# Patient Record
Sex: Female | Born: 1950 | Race: White | Hispanic: No | Marital: Single | Smoking: Former smoker
Health system: Southern US, Community
[De-identification: ages and names within clinical notes are randomized; demographics above are authoritative.]

## PROBLEM LIST (undated history)

## (undated) DIAGNOSIS — E079 Disorder of thyroid, unspecified: Secondary | ICD-10-CM

## (undated) DIAGNOSIS — M069 Rheumatoid arthritis, unspecified: Secondary | ICD-10-CM

## (undated) DIAGNOSIS — G35 Multiple sclerosis: Secondary | ICD-10-CM

## (undated) HISTORY — DX: Rheumatoid arthritis, unspecified: M06.9

## (undated) HISTORY — PX: ABDOMINAL HYSTERECTOMY: SHX81

## (undated) HISTORY — DX: Disorder of thyroid, unspecified: E07.9

## (undated) HISTORY — PX: EYE SURGERY: SHX253

## (undated) HISTORY — DX: Multiple sclerosis: G35

---

## 2000-12-13 ENCOUNTER — Emergency Department (HOSPITAL_COMMUNITY): Admission: EM | Admit: 2000-12-13 | Discharge: 2000-12-13 | Payer: Self-pay

## 2001-12-02 ENCOUNTER — Emergency Department (HOSPITAL_COMMUNITY): Admission: EM | Admit: 2001-12-02 | Discharge: 2001-12-02 | Payer: Self-pay | Admitting: Emergency Medicine

## 2001-12-02 ENCOUNTER — Encounter: Payer: Self-pay | Admitting: Emergency Medicine

## 2003-04-22 ENCOUNTER — Other Ambulatory Visit: Admission: RE | Admit: 2003-04-22 | Discharge: 2003-04-22 | Payer: Self-pay | Admitting: Internal Medicine

## 2015-04-09 ENCOUNTER — Ambulatory Visit: Payer: BLUE CROSS/BLUE SHIELD | Admitting: Neurology

## 2020-08-07 ENCOUNTER — Ambulatory Visit: Payer: BC Managed Care – PPO | Admitting: Neurology

## 2020-11-03 ENCOUNTER — Ambulatory Visit (INDEPENDENT_AMBULATORY_CARE_PROVIDER_SITE_OTHER): Payer: BC Managed Care – PPO | Admitting: Neurology

## 2020-11-03 ENCOUNTER — Encounter: Payer: Self-pay | Admitting: Neurology

## 2020-11-03 VITALS — BP 133/82 | HR 71 | Ht 64.0 in | Wt 118.0 lb

## 2020-11-03 DIAGNOSIS — M542 Cervicalgia: Secondary | ICD-10-CM

## 2020-11-03 DIAGNOSIS — M059 Rheumatoid arthritis with rheumatoid factor, unspecified: Secondary | ICD-10-CM | POA: Diagnosis not present

## 2020-11-03 DIAGNOSIS — G35 Multiple sclerosis: Secondary | ICD-10-CM

## 2020-11-03 DIAGNOSIS — R200 Anesthesia of skin: Secondary | ICD-10-CM | POA: Diagnosis not present

## 2020-11-03 MED ORDER — OXCARBAZEPINE 300 MG PO TABS: 300.0000 mg | ORAL_TABLET | Freq: Two times a day (BID) | ORAL | 5 refills | Status: AC

## 2020-11-03 NOTE — Progress Notes (Addendum)
[facility]  PATIENT: Vanessa Lynch DOB: 1950/11/23  REFERRING DOCTOR OR PCP: [name], MD; [name] (rheumatology); Rodrigo Ran MD (PCP) SOURCE: Patient, notes from neurology ([facility] and [facility]), laboratory reports, imaging reports  _________________________________   HISTORICAL  CHIEF COMPLAINT:  Chief Complaint  Patient presents with   New Patient (Initial Visit)    MS. Taking Rituxan for RA. Taking arava for MS. Has seeen 2 neurologists at [facility]. Referred from Dr. Larose Kells at [place].    HISTORY OF PRESENT ILLNESS:  I had the pleasure of seeing your patient, Vanessa Lynch, at the MS center [facility] for neurologic consultation regarding her diagnosis of primary progressive MS and treatment options.  She is a 70 year old woman who was diagnosed with MS in 2016 after presenting with spasticity, weakness and abnormal brain MRI.  In 1991, she had Lyme disease.  She had a Bullseye rash  She saw infectious disease and it was treated.   She was also found to have an autoimmune thyroid issue a that time.    She had no neurologic issues with the infection or afterwards.  She was diagnosed with RA in 2015 due to right thumb pain and additional testing and xrays.  .   She was initially treated with methotrexate.   She was having spasticity, left > right, and was referred to Neurology.  The spasticity had developed slowly starting in 2012 and is leg > arm, left > right.   .    She was referred to Dr. [name] ad then Dr. [name].   An MRI was worrisome for MS with white matter foci including periventricular foci.  MS was considered due to these symptoms and MRI.  An LP was reportedly inconclusive as it was a bloody tap.  However, according to [facility] neurology notes, oligoclonal bands were not seen.  She was referred to Dr. [name].    She felt she had PPMS and after discussion with Rheumatology, Rituxan was started in 2017.     Compared to  2068, her spasticity has slowly worsened.    Currently, she has noted some gait disturbance due to the left leg but she does not need a cane.   She has the spasticity and takes baclofen.   She initially tried tizanidie which worked better at first but then stopped..    She can walk > 1 mile but gets very tired if she walks more and is very tired for a few days afterwards.     She has numbness and tingling in her feet/legs ad left shin pain.    She has longstanding reduced vision on the right and notes some blurring at times on the left.    Bladder has some frequency but no incontinence.   .    She has dysesthetic pain in the back of the head bilateral and pressing in this region causes pain in the face.   She ha right facial pain that sometimes is like a jolt and can be triggered by pushing the occiput.   Topamax helped initially but now pain is back.   She could not tolerate gabapentin.   Dental exam was fine.   She also has neck pain and pain in the upper back.   She has migraines, helped by topiramate.   She is doing PT   She notes fatigue and mild cognitive dysfunction at times.  Word finding is sometimes difficult.  She is not sure if she is getting a  benefit from Rituxan and is concerned about the risk of increased cancer.    She feels the slope of the decline if no better on Rituxan.  It has not helped her RA pain much.    She is also on leflunomide   Imaging reports: MRI 08/08/2018:   Similar pattern (Compared to 08/17/2016), distribution, and burden of the periventricular and juxtacortical T2/FLAIR hyperintense signal, many of which are perpendicularly oriented to the lateral ventricles. No abnormal enhancement or diffusion signal. No evidence of acute infarct. No mass effect, acute hemorrhage, or hydrocephalus. No progressive cerebral volume loss. Grossly normal flow-related signal in the major intracranial arteries and dural sinuses.      MRI Cervical spine 1221/2016 showed a normal spial cord,   mild spinal stenosis at C5C6 and other DJD at [facility] with foraminal narrowing.  MRI thoracic spine showed a nornal spinal cord and no DJD   REVIEW OF SYSTEMS: Constitutional: No fevers, chills, sweats, or change in appetite Eyes: No visual changes, double vision, eye pain Ear, nose and throat: No hearing loss, ear pain, nasal congestion, sore throat Cardiovascular: No chest pain, palpitations Respiratory:  No shortness of breath at rest or with exertion.   No wheezes GastrointestinaI: No nausea, vomiting, diarrhea, abdominal pain, fecal incontinence Genitourinary:  No dysuria, urinary retention or frequency.  No nocturia. Musculoskeletal:  No neck pain, back pain Integumentary: No rash, pruritus, skin lesions Neurological: as above Psychiatric: No depression at this time.  No anxiety Endocrine: No palpitations, diaphoresis, change in appetite, change in weigh or increased thirst Hematologic/Lymphatic:  No anemia, purpura, petechiae. Allergic/Immunologic: No itchy/runny eyes, nasal congestion, recent allergic reactions, rashes  ALLERGIES: Allergies  Allergen Reactions   Gabapentin     HOME MEDICATIONS:  Current Outpatient Medications:    alendronate (FOSAMAX) 70 MG tablet, Take 70 mg by mouth once a week. Take with a full glass of water on an empty stomach., Disp: , Rfl:    baclofen (LIORESAL) 10 MG tablet, Take 15 mg by mouth 2 (two) times daily., Disp: , Rfl:    BIOTIN PO, Take by mouth., Disp: , Rfl:    leflunomide (ARAVA) 20 MG tablet, Take 20 mg by mouth daily., Disp: , Rfl:    levothyroxine (SYNTHROID) 88 MCG tablet, Take 88 mcg by mouth daily before breakfast., Disp: , Rfl:    methylphenidate (RITALIN) 20 MG tablet, Take 20 mg by mouth daily., Disp: , Rfl:    predniSONE (DELTASONE) 5 MG tablet, Take 5 mg by mouth daily with breakfast., Disp: , Rfl:    promethazine (PHENERGAN) 25 MG tablet, Take 25 mg by mouth every 6 (six) hours as needed for nausea or vomiting., Disp: , Rfl:     riTUXimab (RITUXAN IV), Inject into the vein., Disp: , Rfl:    rizatriptan (MAXALT) 10 MG tablet, Take 10 mg by mouth as needed for migraine. May repeat in 2 hours if needed, Disp: , Rfl:    topiramate (TOPAMAX) 50 MG tablet, Take 75 mg by mouth 2 (two) times daily., Disp: , Rfl:    valACYclovir (VALTREX) 1000 MG tablet, Take 1,000 mg by mouth daily as needed., Disp: , Rfl:   PAST MEDICAL HISTORY: Past Medical History:  Diagnosis Date   Multiple sclerosis (HCC)    RA (rheumatoid arthritis) (HCC)    Thyroid disease     PAST SURGICAL HISTORY:   FAMILY HISTORY: No family history on file.  SOCIAL HISTORY:  Social History   Socioeconomic History   Marital status: Single  Spouse name: Not on file   Number of children: Not on file   Years of education: Not on file   Highest education level: Not on file  Occupational History   Not on file  Tobacco Use   Smoking status: Former    Types: Cigarettes   Smokeless tobacco: Never  Substance and Sexual Activity   Alcohol use: Not on file   Drug use: Not on file   Sexual activity: Not on file  Other Topics Concern   Not on file  Social History Narrative   Not on file   Social Determinants of Health   Financial Resource Strain: Not on file  Food Insecurity: Not on file  Transportation Needs: Not on file  Physical Activity: Not on file  Stress: Not on file  Social Connections: Not on file  Intimate Partner Violence: Not on file     PHYSICAL EXAM  Vitals:   11/03/20 0908  BP: 133/82  Pulse: 71  Weight: 118 lb (53.5 kg)  Height: 5\' 4"  (1.626 m)    Body mass index is 20.25 kg/m.   General: The patient is well-developed and well-nourished and in no acute distress  HEENT:  Head is [place]/AT.  Sclera are anicteric.    Neck: No carotid bruits are noted.  The neck is nontender.  Cardiovascular: The heart has a regular rate and rhythm with a normal S1 and S2. There were no murmurs, gallops or rubs.    Skin:  Extremities are without rash or  edema.  Musculoskeletal:  Back is nontender  Neurologic Exam  Mental status: The patient is alert and oriented x 3 at the time of the examination. The patient has apparent normal recent and remote memory, with an apparently normal attention span and concentration ability.   Speech is normal.  Cranial nerves: Extraocular movements are full. Pupils are equal, round, and reactive to light and accomodation.  Visual fields are full.  Facial symmetry is present. There is reduced sensation on the right V2 distribution.  .Facial strength is normal.  Trapezius and sternocleidomastoid strength is normal. No dysarthria is noted.  The tongue is midline, and the patient has symmetric elevation of the soft palate. Reduced right hearing.  Weber lateralized left.   Motor:  Muscle bulk is normal.   Tone is normal. Strength is  5 / 5 in the arms except for 4+/5 right grip.  Strength was 4+/5 in right hip flexion.  5/5 elsewhere.  Sensory: Sensory testing is intact to pinprick, soft touch and vibration sensation in the legs but she reported mildly reduced right vibration sensation and right temperature sensation in toes and arms.   Coordination: Cerebellar testing reveals good finger-nose-finger and heel-to-shin bilaterally.  Gait and station: Station is normal.   Gait is mildly wide.  No definite foot drop.  Tandem gait is wide. Romberg is negative.   Reflexes: Deep tendon reflexes are symmetric and normal in the arms.  Reflexes were increased in the right knee relative to the left knee.  No ankle clonus..   Plantar responses are flexor.      ASSESSMENT AND PLAN  Multiple sclerosis (HCC) - Plan: MR CERVICAL SPINE WO CONTRAST  Neck pain - Plan: MR CERVICAL SPINE WO CONTRAST  Numbness - Plan: MR CERVICAL SPINE WO CONTRAST  Rheumatoid arthritis with positive rheumatoid factor, involving unspecified site [facility]) - Plan: MR CERVICAL SPINE WO CONTRAST    In summary, Ms.  Uher is a 70 year old woman with a diagnosis of primary  progressive MS.  Unfortunately, she did not have her actual MRI images with her and I was unable to get access through the computer as they were done at [facility].  Of note, according to the report, MRI of the cervical and thoracic spine showed normal spinal cord and no significant spinal stenosis.  It is certainly possible that she does not have a primary progressive MS as a normal spinal cord MRI would be unusual.  We have requested the MRI images.     A complicating factor is that she also has a diagnosis of rheumatoid arthritis and notes significant joint pain at times.  Treatment options for RA are limited due to the MS.  Even if we are certain that she has primary progressive MS, it is possible that due to her age and the nature of PPMS, any potential benefit would be slight.  I will personally review the MRI.  If MRI looks like MS -then consider repeat LP to be more certain of the diagnosis.   Also consider Cosentyx instead of Rituxan as safe in MS and possible effective in RA.    If MRI does not look like MS - then could go on any RA medicaion.     To help with the facial pain, oxcarbazepine will be tried at a low dose and titrated up depending on results and tolerability.  She will call in 3 to 4 weeks if she wants to try a higher dose.  She will return in 2 months or sooner if there are new or worsening neurologic symptoms.  Thank you for asking me to see Ms. Frate .   Please let me know if I can be of further assistance with her or other patients in the future.  Teodor Prater A. Epimenio Foot, MD, [facility] 11/03/2020, 9:39 AM Certified in Neurology, Clinical Neurophysiology, Sleep Medicine and Neuroimaging  [facility] [address] [place], [place] [address] [phone]

## 2020-11-06 ENCOUNTER — Telehealth: Payer: Self-pay | Admitting: Neurology

## 2020-11-06 NOTE — Telephone Encounter (Signed)
[employer] Berkley Harvey: [phone] (exp. 11/05/20 to 12/04/20)  Patient is scheduled at [facility] for 11/15/20.

## 2020-11-11 ENCOUNTER — Telehealth: Payer: Self-pay | Admitting: *Deleted

## 2020-11-11 NOTE — Telephone Encounter (Signed)
R/c cd from [facility] cd on nurse desk

## 2020-11-15 ENCOUNTER — Other Ambulatory Visit: Payer: Self-pay

## 2020-11-15 ENCOUNTER — Ambulatory Visit (INDEPENDENT_AMBULATORY_CARE_PROVIDER_SITE_OTHER): Payer: BC Managed Care – PPO

## 2020-11-15 DIAGNOSIS — G35 Multiple sclerosis: Secondary | ICD-10-CM | POA: Diagnosis not present

## 2020-11-15 DIAGNOSIS — M542 Cervicalgia: Secondary | ICD-10-CM | POA: Diagnosis not present

## 2020-11-15 DIAGNOSIS — R200 Anesthesia of skin: Secondary | ICD-10-CM | POA: Diagnosis not present

## 2020-11-15 DIAGNOSIS — M059 Rheumatoid arthritis with rheumatoid factor, unspecified: Secondary | ICD-10-CM | POA: Diagnosis not present

## 2020-11-18 ENCOUNTER — Telehealth: Payer: Self-pay | Admitting: Neurology

## 2020-11-18 DIAGNOSIS — G35 Multiple sclerosis: Secondary | ICD-10-CM

## 2020-11-18 DIAGNOSIS — R9082 White matter disease, unspecified: Secondary | ICD-10-CM

## 2020-11-18 MED ORDER — TOPIRAMATE 50 MG PO TABS: 75.0000 mg | ORAL_TABLET | Freq: Two times a day (BID) | ORAL | 11 refills | Status: DC

## 2020-11-18 NOTE — Telephone Encounter (Signed)
I spoke with Vanessa Lynch about her cervical spine MRI.  The spinal cord was normal.  Specifically, there is no evidence of demyelination.  She does have significant degenerative changes with joint effusions and facet hypertrophy at C2-C3.  Some fluid is also noted around the facet joints at C1-C2.  These degenerative changes could explain the pain in the top of the neck and the back of the head.    I also reviewed her MRI of the brain and cervical spine from 10/02/2014. Although she has a diagnosis of primary progressive MS, the cervical spine did not show any T2 hyperintense foci.  Additionally, the foci present in the brain, though more than expected for age, are nonspecific with most of them being in the deep and subcortical white matter and just a few being periventricular.  34, I am not certain of her diagnosis of primary progressive MS pill we need to get additional information (she has rheumatoid arthritis that has not responded to well to leflunomide plus Rituxan and she would have many more options if she does not have a primary progressive MS than if she does).  In 2016, she had a lumbar puncture but it was a bloody tap in the studies were reportedly not able to be interpreted.  Therefore, I will set her up for a fluoroscopically guided lumbar puncture.

## 2020-11-18 NOTE — Telephone Encounter (Signed)
Additionally,  I will increase the oxcarbazepine to 3 times a day for the trigeminal neuralgia-like pain

## 2020-11-27 ENCOUNTER — Ambulatory Visit
Admission: RE | Admit: 2020-11-27 | Discharge: 2020-11-27 | Disposition: A | Discharge status: Home / Self Care | Payer: BC Managed Care – PPO | Source: Ambulatory Visit | Attending: Neurology | Admitting: Neurology

## 2020-11-27 ENCOUNTER — Other Ambulatory Visit: Payer: Self-pay

## 2020-11-27 DIAGNOSIS — R9082 White matter disease, unspecified: Secondary | ICD-10-CM

## 2020-11-27 DIAGNOSIS — G35 Multiple sclerosis: Secondary | ICD-10-CM

## 2020-11-27 NOTE — Progress Notes (Signed)
1 vial of blood drawn from pts RAC to be sent with LP lab work. 1 successful attempt. Pt tolerated well. Gauze and tape applied after. Pt reports she does not have a driver to drive her home after her LP today. I notified Dr. Karin Golden and he stated he would still do the procedure today and pt would be okay to drive home today.

## 2020-11-27 NOTE — Discharge Instructions (Signed)

## 2020-12-01 ENCOUNTER — Telehealth: Payer: Self-pay | Admitting: Neurology

## 2020-12-01 NOTE — Telephone Encounter (Signed)
Received a call from [facility] indicating that patient has received results from LP that was completed. [facility] has faxed the report to [place] for review. Will send to Dr Epimenio Foot to review.

## 2020-12-03 ENCOUNTER — Telehealth: Payer: Self-pay | Admitting: Neurology

## 2020-12-03 LAB — CNS IGG SYNTHESIS RATE, CSF+BLOOD
Albumin Serum: 4 g/dL (ref 3.2–4.6)
Albumin, CSF: 31.6 mg/dL (ref 8.0–42.0)
CNS-IgG Synthesis Rate: 0.1 mg/24 h (ref <=3.3)
IgG (Immunoglobin G), Serum: 421 mg/dL — ABNORMAL LOW (ref 600–1540)
IgG Total CSF: 1.8 mg/dL (ref 0.8–7.7)
IgG-Index: 0.54 (ref <0.66)

## 2020-12-03 LAB — CSF CELL COUNT WITH DIFFERENTIAL
RBC Count, CSF: 1 cells/uL — ABNORMAL HIGH
WBC, CSF: 2 cells/uL (ref 0–5)

## 2020-12-03 LAB — PROTEIN, CSF: Total Protein, CSF: 51 mg/dL (ref 15–60)

## 2020-12-03 LAB — GLUCOSE, CSF: Glucose, CSF: 50 mg/dL (ref 40–80)

## 2020-12-03 LAB — OLIGOCLONAL BANDS, CSF + SERM

## 2020-12-03 LAB — VDRL, CSF: VDRL Quant, CSF: NONREACTIVE

## 2020-12-03 NOTE — Telephone Encounter (Signed)
I spoke to the patient about the lumbar puncture results.  The oligoclonal bands were negative.  IgG index was normal.  I do not believe that she has MS.    On my review, the MRI changes in the brain are nonspecific.  Furthermore she has no spinal cord plaques.  The MRI of the cervical spine did not show a fair amount of degenerative change in the upper cervical spine and certainly that is causing some of her symptoms.    She she has an appointment to see me next week.  We will reevaluate her history and findings to see if another diagnosis could better explain her symptoms.  She has been on Rituxan for presumed primary progressive MS.  She did get an infusion earlier this week.  I discussed that she could probably go ahead and stop those infusions.  It is possible that the Rituxan could help her rheumatoid arthritis but other medications may help her more.

## 2020-12-08 ENCOUNTER — Ambulatory Visit (INDEPENDENT_AMBULATORY_CARE_PROVIDER_SITE_OTHER): Payer: BC Managed Care – PPO | Admitting: Neurology

## 2020-12-08 ENCOUNTER — Encounter: Payer: Self-pay | Admitting: Neurology

## 2020-12-08 VITALS — BP 146/69 | HR 68 | Ht 63.5 in | Wt 123.0 lb

## 2020-12-08 DIAGNOSIS — R9082 White matter disease, unspecified: Secondary | ICD-10-CM | POA: Diagnosis not present

## 2020-12-08 DIAGNOSIS — R200 Anesthesia of skin: Secondary | ICD-10-CM | POA: Diagnosis not present

## 2020-12-08 DIAGNOSIS — M542 Cervicalgia: Secondary | ICD-10-CM | POA: Diagnosis not present

## 2020-12-08 DIAGNOSIS — M059 Rheumatoid arthritis with rheumatoid factor, unspecified: Secondary | ICD-10-CM | POA: Diagnosis not present

## 2020-12-08 DIAGNOSIS — R269 Unspecified abnormalities of gait and mobility: Secondary | ICD-10-CM

## 2020-12-08 MED ORDER — TIZANIDINE HCL 4 MG PO TABS: 4.0000 mg | ORAL_TABLET | Freq: Three times a day (TID) | ORAL | 11 refills | Status: AC

## 2020-12-08 MED ORDER — RIZATRIPTAN BENZOATE 10 MG PO TABS: 10.0000 mg | ORAL_TABLET | ORAL | 11 refills | Status: AC | PRN

## 2020-12-08 MED ORDER — METHYLPHENIDATE HCL ER (OSM) 36 MG PO TBCR: 36.0000 mg | EXTENDED_RELEASE_TABLET | Freq: Every day | ORAL | 0 refills | Status: AC

## 2020-12-08 MED ORDER — PROMETHAZINE HCL 12.5 MG PO TABS: 12.5000 mg | ORAL_TABLET | Freq: Four times a day (QID) | ORAL | 5 refills | Status: AC | PRN

## 2020-12-08 NOTE — Progress Notes (Signed)
[facility]  PATIENT: Vanessa Lynch DOB: 10/06/1950  REFERRING DOCTOR OR PCP: [name], MD; [name] (rheumatology); Rodrigo Ran MD (PCP) SOURCE: Patient, notes from neurology ([facility]), laboratory reports, imaging reports  _________________________________   HISTORICAL  CHIEF COMPLAINT:  Chief Complaint  Patient presents with   Follow-up    Pt alone, rm 2. Here to follow up on recent test results.     HISTORY OF PRESENT ILLNESS:  I had the pleasure of seeing your patient, Vanessa Lynch, at the [facility] center [facility] for neurologic consultation regarding her diagnosis of primary progressive MS and treatment options.  Update 10/32022: She has had a diagnosis of PPMS.    However, the MRI of the brain shows non-specific foci in the brain and MRI of the cervical spine showed no foci (had a lot of degenerative changes in the upper cervical spine).   Furthermore, the CSF did not have oligoclonal bands.   Therefore, I do not think she has PPMS or SPMS.   Much of her pain likely comes from RA +/- OA.    She has spasticity in her legs, helped by baclofen 15 mg po bid.     She was once on tizandine and thinks it helped her better than the baclofen.   She has poor vision and sees opthalmology frequently.  Her gait is a little off balance.  No recent falls.  She notes some uncomfortable sensations in her legs.  She reports a lot of joint pain and also pain in the upper neck.  She is on leflunomide for rheumatoid arthritis.  She reports a lot of fatigue.   She also notes cognitive fog at time.s   Ritalin 20 mg po qAM helps her for several hors in the morning ut then wears off.   We discussed Concerta as an option.    She feels better on high dose biotin (300 mg).   She stops x 1- days before any thyroid testing.    She also has hyperthyroid and is on 0.88 Synthroid.    She has been on Rituxan for presumed primary progressive MS.   She did get an infusion earlier this week.  I discussed that she could probably go ahead and stop those infusions.  It is possible that the Rituxan could help her rheumatoid arthritis but other medications may help her more.  I reviewed the MRI of the cervical spine recently performed and also was able to review the MRI of the brain from 2016.   The spinal cord was normal.  Specifically, there is no evidence of demyelination.  She does have significant degenerative changes with joint effusions and facet hypertrophy at C2-C3.  Some fluid is also noted around the facet joints at C1-C2.  These degenerative changes could explain the pain in the top of the neck and the back of the head.   MRI of the brain and cervical spine from 10/02/2014. The cervical spine did not show any T2 hyperintense foci.  Additionally, the foci present in the brain, though more than expected for age, are nonspecific with most of them being in the deep and subcortical white matter and just a few being periventricular.     History of neurologic and other symptoms: In 1991, she had Lyme disease.  She had a Bullseye rash  She saw infectious disease and it was treated.   She was also found to have an autoimmune thyroid issue a that time.  She had no neurologic issues with the infection or afterwards.  She was diagnosed with RA in 2015 due to right thumb pain and additional testing and xrays.  .   She was initially treated with methotrexate.   She was having spasticity, left > right, and was referred to Neurology.  The spasticity had developed slowly starting in 2012 and is leg > arm, left > right.   .    She was referred to Dr. [name] ad then Dr. [name].   An MRI was worrisome for MS with white matter foci including periventricular foci.  MS was considered due to these symptoms and MRI.  An LP was reportedly inconclusive as it was a bloody tap.  However, according to [facility] neurology notes, oligoclonal bands were not seen.  She was  referred to Dr. [name].    She felt she had PPMS and after discussion with Rheumatology, Rituxan was started in 2017.     Compared to 2017, her spasticity has slowly worsened.   She is not sure if she is getting a benefit from Rituxan and is concerned about the risk of increased cancer.    She feels the slope of the decline if no better on Rituxan.  It has not helped her RA pain much.    She is also on leflunomide   Imaging: MRI 08/08/2018 report:   Unchanged pattern. distribution, and burden of the periventricular and juxtacortical T2/FLAIR hyperintense signal, some of which are perpendicularly oriented to the lateral ventricles.No abnormal enhancement or diffusion signal. No evidence of acute infarct. No mass effect, acute hemorrhage, or hydrocephalus. No progressive cerebral volume loss. Grossly normal flow-related signal in the major intracranial arteries and dural sinuses.      MRI thoracic spine (report) from 2016 showed a nornal spinal cord and no DJD  MRI of the brain and cervical spine from 10/02/2014: Although she has a diagnosis of primary progressive MS, the cervical spine did not show any T2 hyperintense foci.  Additionally, the foci present in the brain, though more than expected for age, are nonspecific with most of them being in the deep and subcortical white matter and just a few being periventricular.  LABS: CSF 11/27/2020 did not show oligoclonal bands in the CSF not present in serum (there were 2 bands that were present in both CSF and serum more consistent with systemic inflammation)  REVIEW OF SYSTEMS: Constitutional: No fevers, chills, sweats, or change in appetite Eyes: No visual changes, double vision, eye pain Ear, nose and throat: No hearing loss, ear pain, nasal congestion, sore throat Cardiovascular: No chest pain, palpitations Respiratory:  No shortness of breath at rest or with exertion.   No wheezes GastrointestinaI: No nausea, vomiting, diarrhea, abdominal pain,  fecal incontinence Genitourinary:  No dysuria, urinary retention or frequency.  No nocturia. Musculoskeletal:  No neck pain, back pain Integumentary: No rash, pruritus, skin lesions Neurological: as above Psychiatric: No depression at this time.  No anxiety Endocrine: No palpitations, diaphoresis, change in appetite, change in weigh or increased thirst Hematologic/Lymphatic:  No anemia, purpura, petechiae. Allergic/Immunologic: No itchy/runny eyes, nasal congestion, recent allergic reactions, rashes  ALLERGIES: Allergies  Allergen Reactions   Gabapentin     HOME MEDICATIONS:  Current Outpatient Medications:    alendronate (FOSAMAX) 70 MG tablet, Take 70 mg by mouth once a week. Take with a full glass of water on an empty stomach., Disp: , Rfl:    BIOTIN PO, Take 300 mg by mouth daily., Disp: , Rfl:  leflunomide (ARAVA) 20 MG tablet, Take 20 mg by mouth daily., Disp: , Rfl:    levothyroxine (SYNTHROID) 88 MCG tablet, Take 88 mcg by mouth daily before breakfast., Disp: , Rfl:    methylphenidate (CONCERTA) 36 MG PO CR tablet, Take 1 tablet (36 mg total) by mouth daily., Disp: 30 tablet, Rfl: 0   Oxcarbazepine (TRILEPTAL) 300 MG tablet, Take 1 tablet (300 mg total) by mouth 2 (two) times daily., Disp: 60 tablet, Rfl: 5   predniSONE (DELTASONE) 5 MG tablet, Take 5 mg by mouth daily with breakfast., Disp: , Rfl:    riTUXimab (RITUXAN IV), Inject into the vein., Disp: , Rfl:    tiZANidine (ZANAFLEX) 4 MG tablet, Take 1 tablet (4 mg total) by mouth 3 (three) times daily., Disp: 90 tablet, Rfl: 11   topiramate (TOPAMAX) 50 MG tablet, Take 1.5 tablets (75 mg total) by mouth 2 (two) times daily., Disp: 90 tablet, Rfl: 11   valACYclovir (VALTREX) 1000 MG tablet, Take 1,000 mg by mouth daily as needed., Disp: , Rfl:    promethazine (PHENERGAN) 12.5 MG tablet, Take 1 tablet (12.5 mg total) by mouth every 6 (six) hours as needed for nausea or vomiting., Disp: 30 tablet, Rfl: 5   rizatriptan  (MAXALT) 10 MG tablet, Take 1 tablet (10 mg total) by mouth as needed for migraine. May repeat in 2 hours if needed, Disp: 12 tablet, Rfl: 11  PAST MEDICAL HISTORY: Past Medical History:  Diagnosis Date   Multiple sclerosis (HCC)    RA (rheumatoid arthritis) (HCC)    Thyroid disease     PAST SURGICAL HISTORY:   FAMILY HISTORY: No family history on file.  SOCIAL HISTORY:  Social History   Socioeconomic History   Marital status: Single    Spouse name: Not on file   Number of children: Not on file   Years of education: Not on file   Highest education level: Not on file  Occupational History   Not on file  Tobacco Use   Smoking status: Former    Types: Cigarettes   Smokeless tobacco: Never  Substance and Sexual Activity   Alcohol use: Not on file   Drug use: Not on file   Sexual activity: Not on file  Other Topics Concern   Not on file  Social History Narrative   Not on file   Social Determinants of Health   Financial Resource Strain: Not on file  Food Insecurity: Not on file  Transportation Needs: Not on file  Physical Activity: Not on file  Stress: Not on file  Social Connections: Not on file  Intimate Partner Violence: Not on file     PHYSICAL EXAM  Vitals:   12/08/20 0930  BP: (!) 146/69  Pulse: 68  Weight: 123 lb (55.8 kg)  Height: 5' 3.5" (1.613 m)    Body mass index is 21.45 kg/m.   General: The patient is well-developed and well-nourished and in no acute distress  HEENT:  Head is Baker/AT.  Sclera are anicteric.    Neck: No carotid bruits are noted.  The neck is nontender.  Cardiovascular: The heart has a regular rate and rhythm with a normal S1 and S2. There were no murmurs, gallops or rubs.    Skin: Extremities are without rash or  edema.  Musculoskeletal:  Back is nontender  Neurologic Exam  Mental status: The patient is alert and oriented x 3 at the time of the examination. The patient has apparent normal recent and remote memory,  with an apparently normal attention span and concentration ability.   Speech is normal.  Cranial nerves: Extraocular movements are full. Pupils are equal, round, and reactive to light and accomodation.  Visual fields are full.  Facial symmetry is present. There is reduced sensation on the right V2 distribution.  .Facial strength is normal.  Trapezius and sternocleidomastoid strength is normal. No dysarthria is noted.  The tongue is midline, and the patient has symmetric elevation of the soft palate. Reduced right hearing.  Weber lateralized left.   Motor:  Muscle bulk is normal.   Tone is normal by my exam.  Strength is  5 / 5 in the arms  Strength was 4+/5 in right hip flexion.  5/5 elsewhere.  Sensory: Sensory testing is intact to pinprick, soft touch and vibration sensation in the legs but she has mildly reduced right vibration sensation in toes.   Coordination: Cerebellar testing reveals good finger-nose-finger and heel-to-shin bilaterally.  Gait and station: Station is normal.   Gait is mildly wide.  She turns in 2 steps (normal) No definite foot drop.  Tandem gait is mildly wide. Romberg is negative.   Reflexes: Deep tendon reflexes are symmetric and normal in the arms.  Reflexes were 2+ right knee 2 left knee.  No ankle clonus..       ASSESSMENT AND PLAN  Rheumatoid arthritis with positive rheumatoid factor, involving unspecified site (HCC)  White matter abnormality on MRI of brain  Neck pain  Numbness  Gait disturbance    I do not think she has PPMS or significant central nervous system process.    MRI of the brain is nonspecific, no cervical spine plaques and CSF is negative.    I advised her to stop the Rituxan.    Ok to continue leflunomide.   We had a very long discussion about this and that I do not have a good explanation for why she feels she has spasticity in her legs (though on exam muscle tone was fine).  I do think that some of her pain is due to the RA.  Hopefully,  when she is off Rituxan more options for rheumatoid arthritis will be available to her. Change baclofen to tizanidine Change ritalin to Concerta 36 mg Renew phenergan 12.5 mg and Maxalt RTC 4 months  42-minute office visit with the majority of the time spent face-to-face for history and physical, discussion/counseling and decision-making.  Additional time with record review and documentation.    Alycen Mack A. Epimenio Foot, MD, [facility] 12/08/2020, 2:21 PM Certified in Neurology, Clinical Neurophysiology, Sleep Medicine and Neuroimaging  [facility] [address] [place], [place] [address] [phone]

## 2020-12-08 NOTE — Patient Instructions (Signed)
For your fatigue and the cognitive issues, we are going to switch you from Ritalin 20 mg to Concerta 36 mg.  This should cover you for longer periods of the day.  We agreed to stop the baclofen and start tizanidine. For 3 days take 1/2 pill twice a day For the next 3 days take 1/2 pill in the morning, 1/2 pill later in the day and 1 pill at bedtime Then you can start taking 3 pills a day

## 2021-02-04 ENCOUNTER — Ambulatory Visit: Payer: BC Managed Care – PPO | Admitting: Neurology

## 2021-05-07 ENCOUNTER — Ambulatory Visit: Payer: BC Managed Care – PPO | Admitting: Neurology

## 2021-12-17 ENCOUNTER — Other Ambulatory Visit: Payer: Self-pay | Admitting: Neurology

## 2022-06-02 ENCOUNTER — Other Ambulatory Visit: Payer: Self-pay | Admitting: Neurology

## 2022-10-26 IMAGING — XA DG SPINAL PUNCT LUMBAR DIAG WITH FL CT GUIDANCE
2 series · 2 of 2 positions shown · non-contrast
Comparison: None

CLINICAL DATA: Possible primary progressive multiple sclerosis.

EXAM:
DIAGNOSTIC LUMBAR PUNCTURE UNDER FLUOROSCOPIC GUIDANCE

[Series 2: ortho standard · 1 of 1 slices shown (1 of 2)]
[im 1/1]
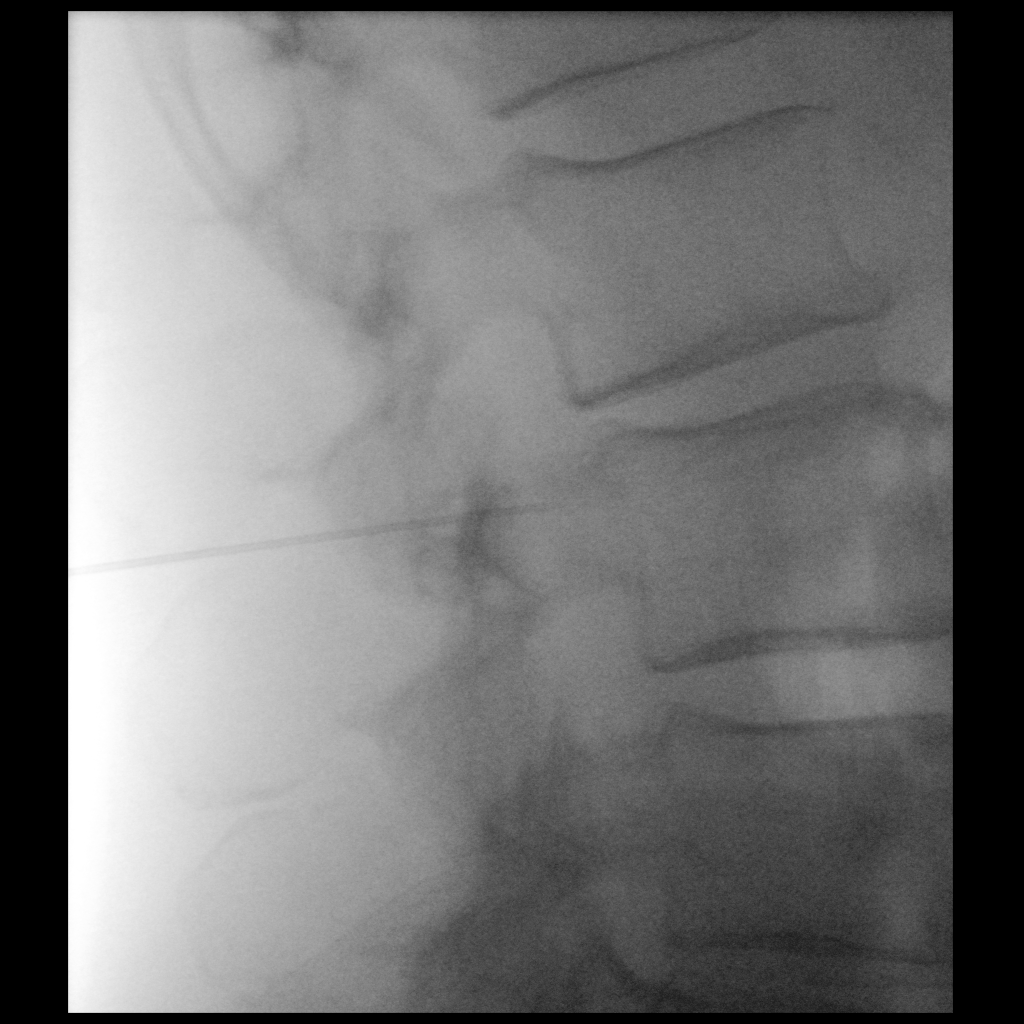

[Series 3: ortho standard · 1 of 1 slices shown (2 of 2)]
[im 1/1]
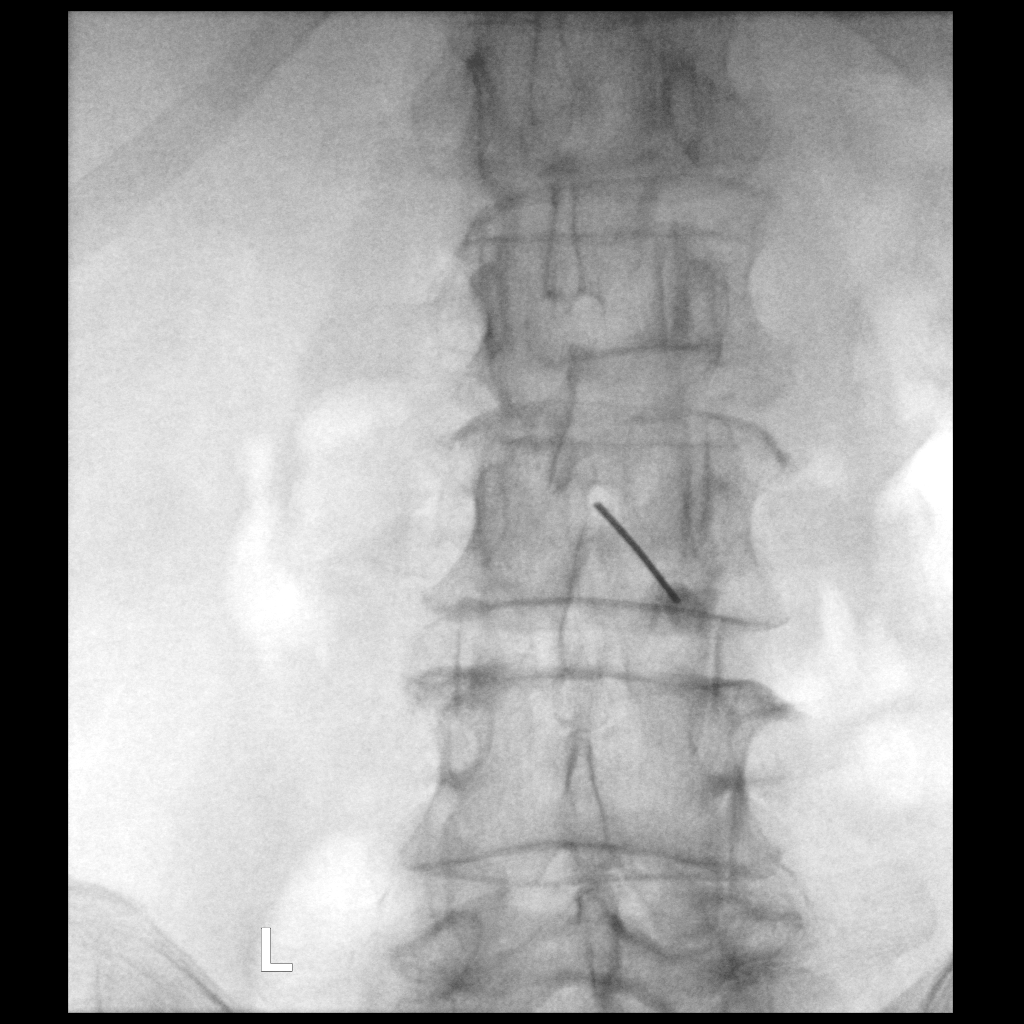

[2 of 2 positions shown; findings below may reference images not displayed]

FLUOROSCOPY TIME:  Fluoroscopy Time: 0 minutes 44 seconds.
micro gray meter squared

PROCEDURE:
Informed consent was obtained from the patient prior to the
procedure, including potential complications of headache, allergy,
and pain. With the patient prone, the lower back was prepped with
Betadine. 1% Lidocaine was used for local anesthesia. Lumbar
puncture was performed at the right L2-3 level using a 20 gauge
needle with return of clear CSF with an opening pressure within the
range of normal. Seven ml of CSF were obtained for laboratory
studies. The patient tolerated the procedure well and there were no
apparent complications.
IMPRESSION: Lumbar puncture on the right at L2-3. Samples sent for requested
studies.
